# Patient Record
Sex: Male | Born: 1960 | Race: White | Hispanic: No | Marital: Married
Health system: Southern US, Community
[De-identification: ages and names within clinical notes are randomized; demographics above are authoritative.]

---

## 2013-11-02 ENCOUNTER — Other Ambulatory Visit: Payer: Self-pay | Admitting: Family Medicine

## 2013-11-02 ENCOUNTER — Ambulatory Visit
Admission: RE | Admit: 2013-11-02 | Discharge: 2013-11-02 | Disposition: A | Discharge status: Home / Self Care | Payer: No Typology Code available for payment source | Source: Ambulatory Visit | Attending: Family Medicine | Admitting: Family Medicine

## 2013-11-02 DIAGNOSIS — M79609 Pain in unspecified limb: Secondary | ICD-10-CM

## 2015-03-26 IMAGING — CR DG TOE 2ND 2+V*L*
3 series · 3 of 3 positions shown · non-contrast
Comparison: None.

CLINICAL DATA: Toe pain.  Dropped object on second toe.

EXAM:
LEFT SECOND TOE

[view not recorded (1 of 3)]
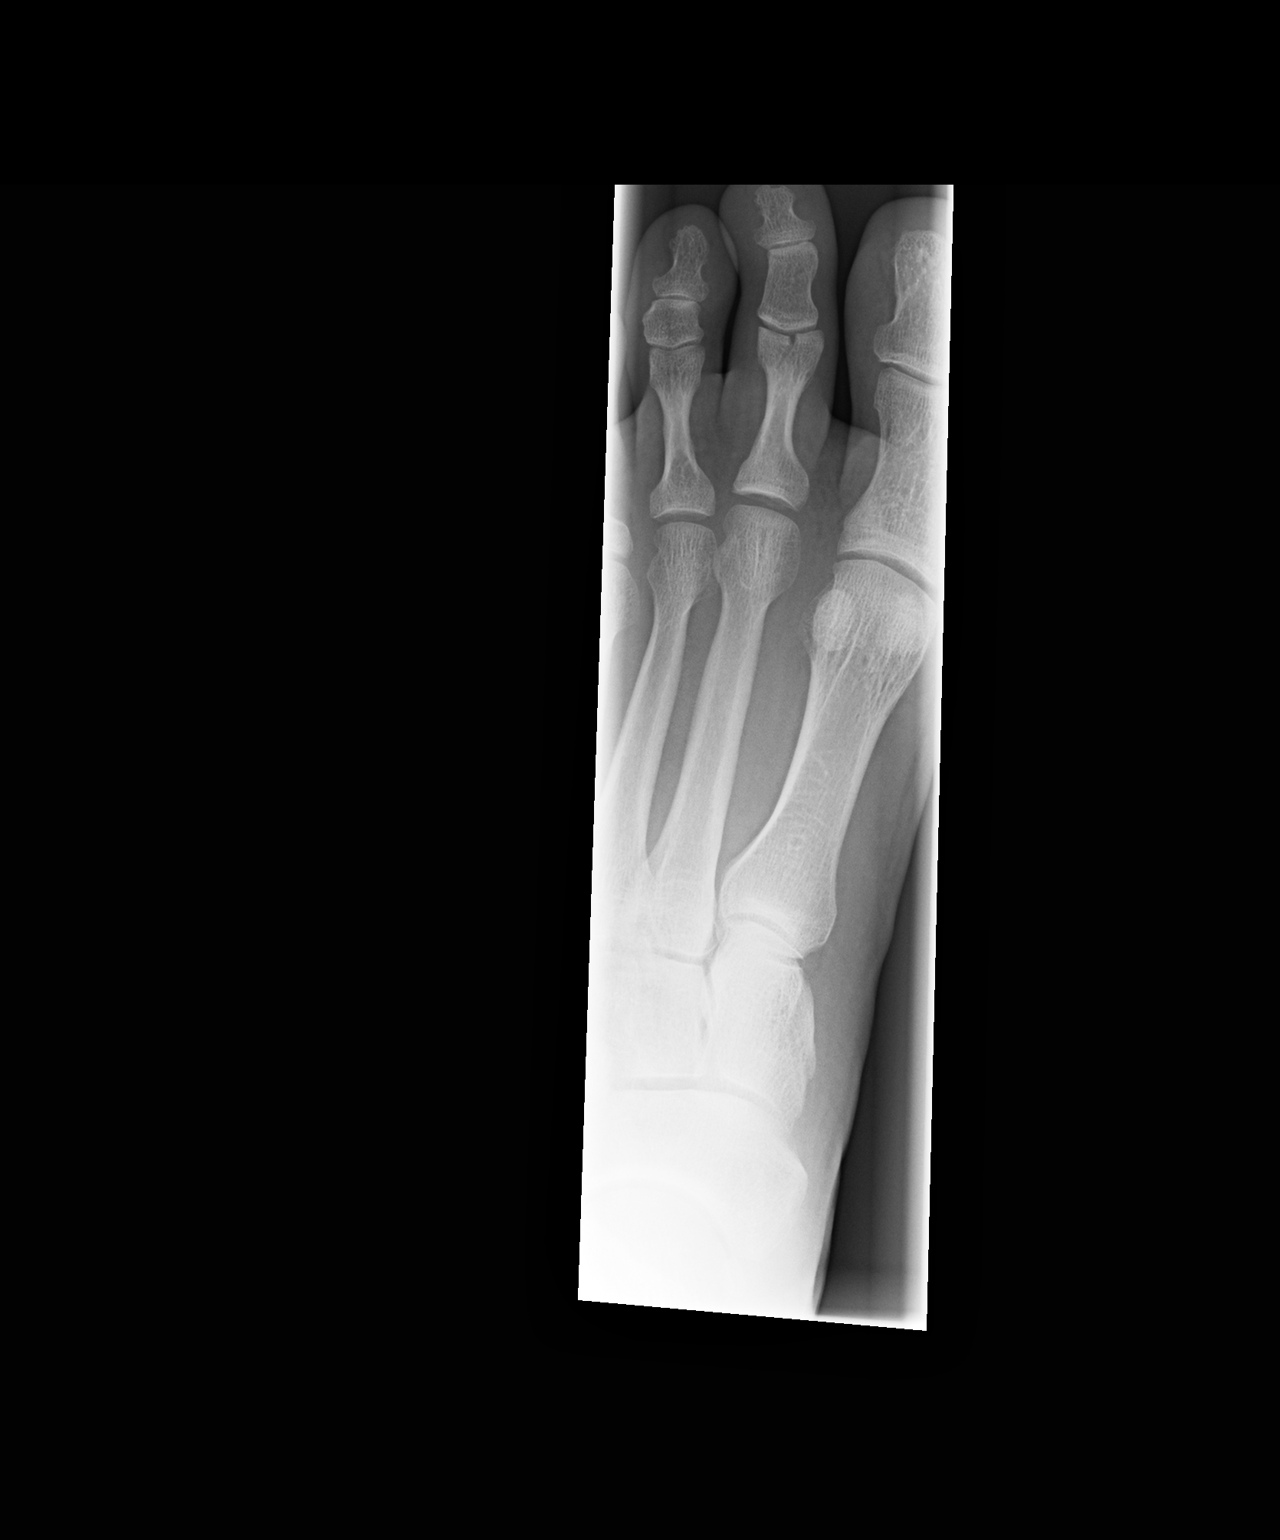

[view not recorded (2 of 3)]
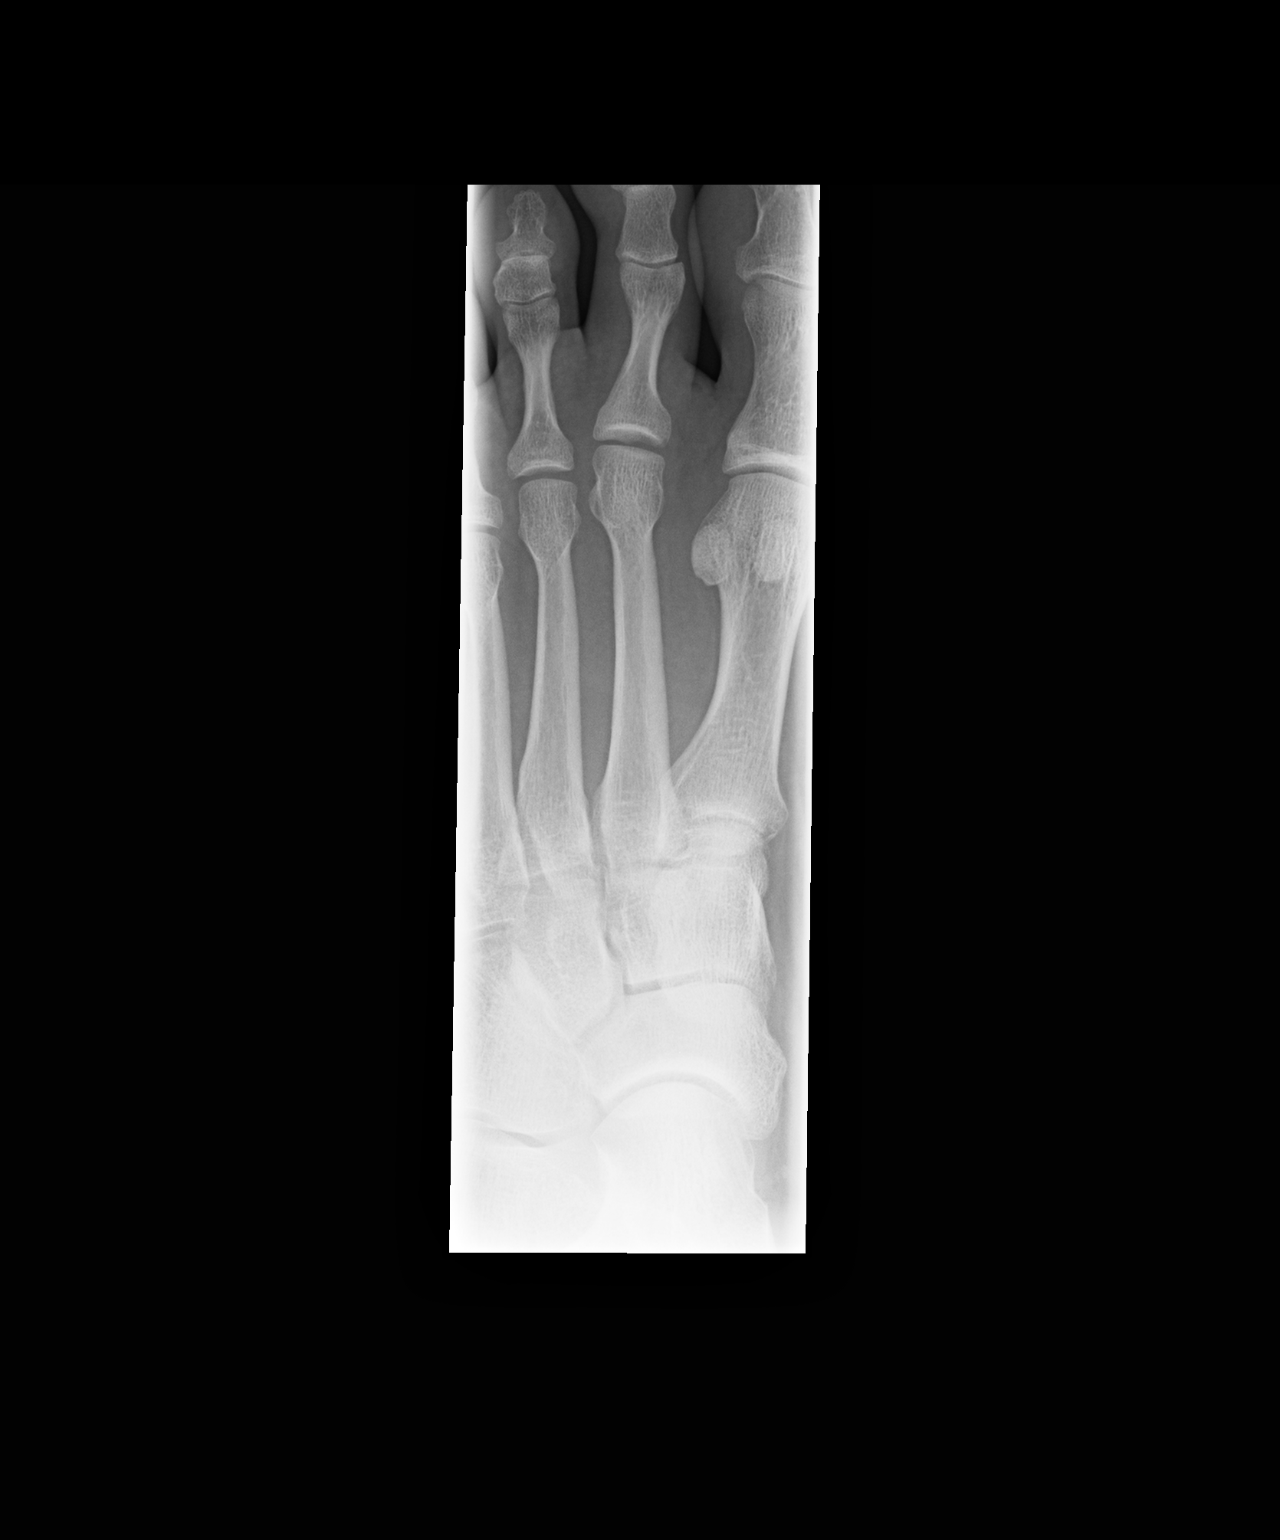

[view not recorded (3 of 3)]
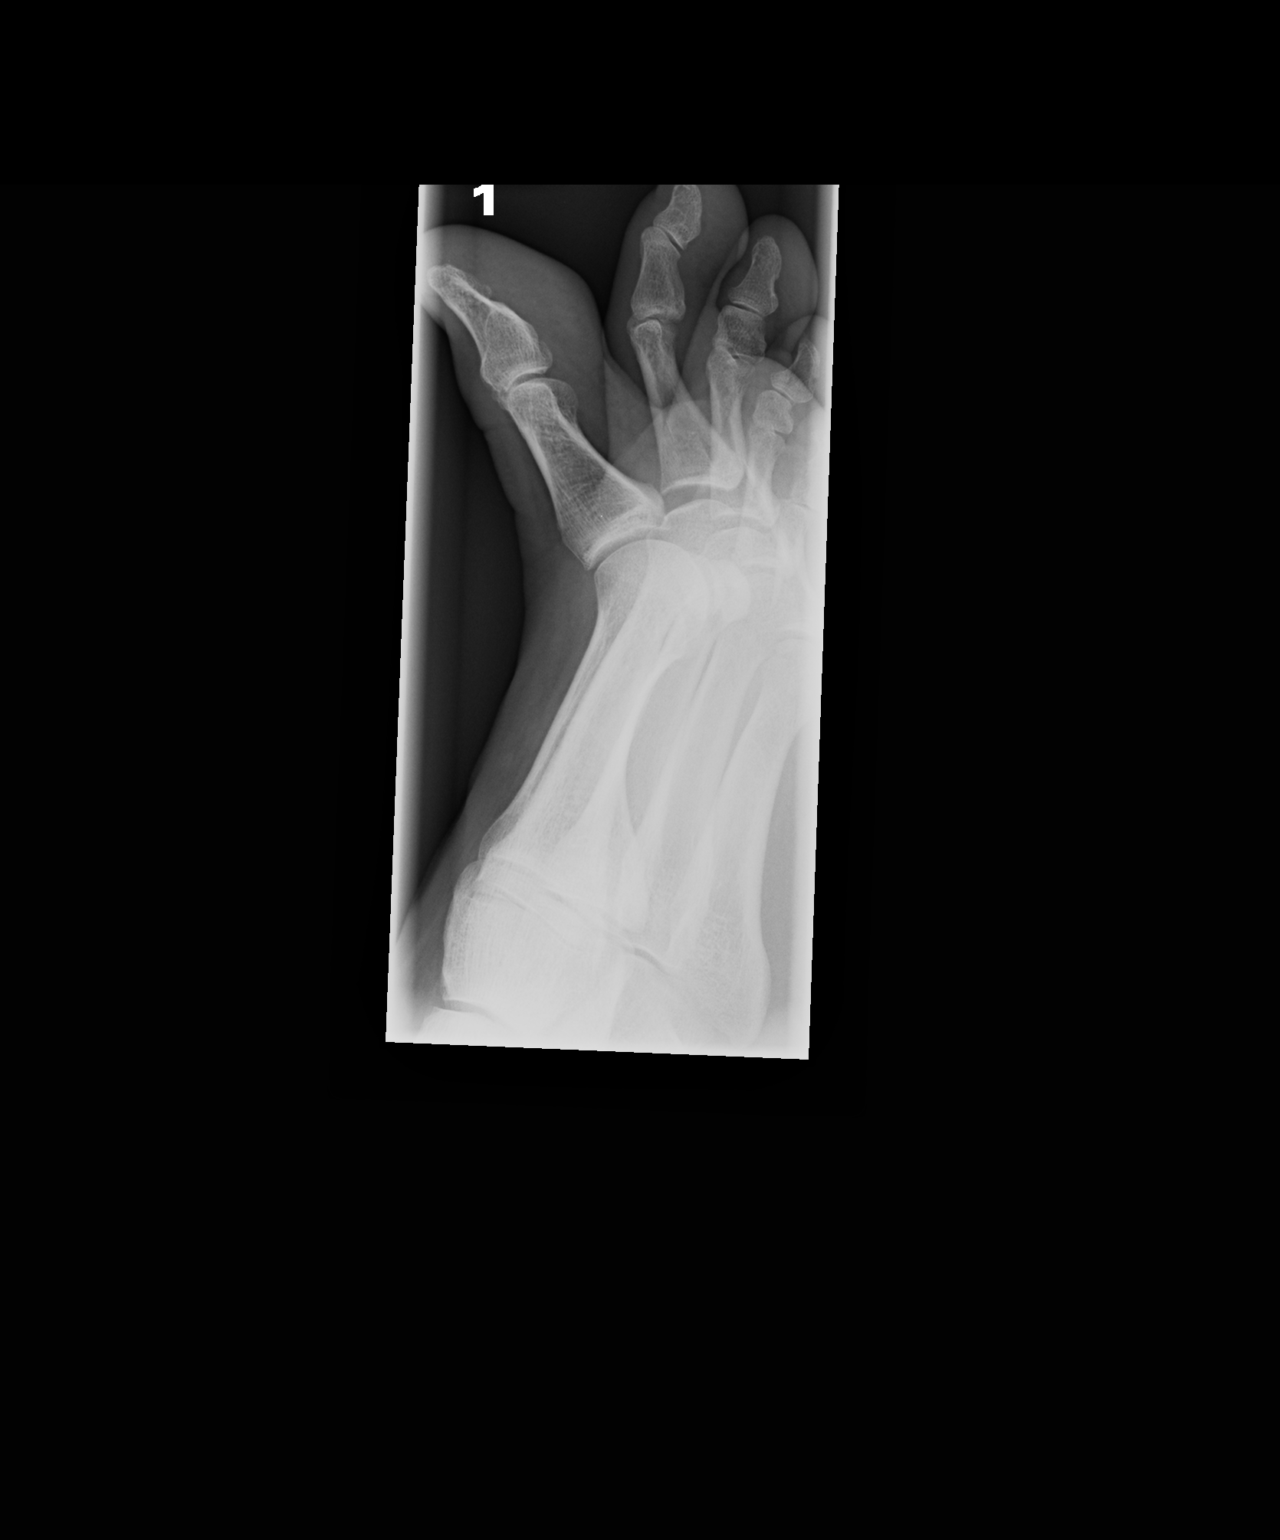

[3 of 3 positions shown; findings below may reference images not displayed]

FINDINGS: There is in intra-articular fracture through the distal aspect of
the left second proximal phalanx, entering the PIP joint. This is
minimally displaced. No additional acute bony abnormality.
IMPRESSION: Intra-articular fracture through the distal aspect of the left
second proximal phalanx.
